# Patient Record
Sex: Female | Born: 1985 | Hispanic: Yes | Marital: Single | Smoking: Never smoker
Health system: Southern US, Community
[De-identification: ages and names within clinical notes are randomized; demographics above are authoritative.]

## PROBLEM LIST (undated history)

## (undated) DIAGNOSIS — Z789 Other specified health status: Secondary | ICD-10-CM

## (undated) DIAGNOSIS — N200 Calculus of kidney: Secondary | ICD-10-CM

---

## 2014-06-04 HISTORY — PX: CERVICAL BIOPSY  W/ LOOP ELECTRODE EXCISION: SUR135

## 2014-07-01 ENCOUNTER — Ambulatory Visit: Payer: Self-pay | Admitting: Family Medicine

## 2016-05-17 ENCOUNTER — Other Ambulatory Visit (HOSPITAL_COMMUNITY): Payer: Self-pay | Admitting: *Deleted

## 2016-05-17 ENCOUNTER — Other Ambulatory Visit: Payer: Self-pay | Admitting: *Deleted

## 2016-05-17 DIAGNOSIS — D492 Neoplasm of unspecified behavior of bone, soft tissue, and skin: Secondary | ICD-10-CM

## 2016-05-24 ENCOUNTER — Ambulatory Visit (HOSPITAL_COMMUNITY)
Admission: RE | Admit: 2016-05-24 | Discharge: 2016-05-24 | Disposition: A | Discharge status: Home / Self Care | Payer: Worker's Compensation | Source: Ambulatory Visit | Attending: *Deleted | Admitting: *Deleted

## 2016-05-24 DIAGNOSIS — D492 Neoplasm of unspecified behavior of bone, soft tissue, and skin: Secondary | ICD-10-CM

## 2016-05-31 ENCOUNTER — Other Ambulatory Visit (HOSPITAL_COMMUNITY): Payer: Self-pay | Admitting: *Deleted

## 2016-05-31 DIAGNOSIS — E041 Nontoxic single thyroid nodule: Secondary | ICD-10-CM

## 2016-06-06 ENCOUNTER — Encounter (HOSPITAL_COMMUNITY): Payer: Self-pay

## 2016-06-06 ENCOUNTER — Encounter (HOSPITAL_COMMUNITY)
Admission: RE | Admit: 2016-06-06 | Discharge: 2016-06-06 | Disposition: A | Discharge status: Home / Self Care | Payer: Self-pay | Source: Ambulatory Visit | Attending: *Deleted | Admitting: *Deleted

## 2016-06-06 DIAGNOSIS — E041 Nontoxic single thyroid nodule: Secondary | ICD-10-CM | POA: Insufficient documentation

## 2016-06-06 MED ORDER — SODIUM IODIDE I 131 CAPSULE
13.0000 | Freq: Once | INTRAVENOUS | Status: AC | PRN
  Administered 2016-06-06: 13 via ORAL

## 2016-06-07 ENCOUNTER — Encounter (HOSPITAL_COMMUNITY)
Admission: RE | Admit: 2016-06-07 | Discharge: 2016-06-07 | Disposition: A | Discharge status: Home / Self Care | Payer: Self-pay | Source: Ambulatory Visit | Attending: *Deleted | Admitting: *Deleted

## 2016-06-07 MED ORDER — SODIUM PERTECHNETATE TC 99M INJECTION
10.0000 | Freq: Once | INTRAVENOUS | Status: AC | PRN
  Administered 2016-06-07: 9.5 via INTRAVENOUS

## 2016-07-06 ENCOUNTER — Other Ambulatory Visit: Payer: Self-pay | Admitting: Otolaryngology

## 2016-07-06 DIAGNOSIS — E041 Nontoxic single thyroid nodule: Secondary | ICD-10-CM

## 2016-09-11 ENCOUNTER — Other Ambulatory Visit (HOSPITAL_COMMUNITY)
Admission: RE | Admit: 2016-09-11 | Discharge: 2016-09-11 | Disposition: A | Discharge status: Home / Self Care | Payer: BLUE CROSS/BLUE SHIELD | Source: Ambulatory Visit | Attending: Unknown Physician Specialty | Admitting: Unknown Physician Specialty

## 2016-09-11 DIAGNOSIS — R87610 Atypical squamous cells of undetermined significance on cytologic smear of cervix (ASC-US): Secondary | ICD-10-CM | POA: Insufficient documentation

## 2017-04-02 IMAGING — US US SOFT TISSUE HEAD/NECK
1 series · 13 of 25 positions shown · non-contrast
Comparison: None.

CLINICAL DATA: Palpable   neoplasm x2 months, minimal tenderness

EXAM:
THYROID ULTRASOUND
TECHNIQUE: Ultrasound examination of the thyroid gland and adjacent soft
tissues was performed.

[Series 1: us soft tissue head/neck · 0.07mm/px · 13 of 56 slices shown]
[im 1/56]
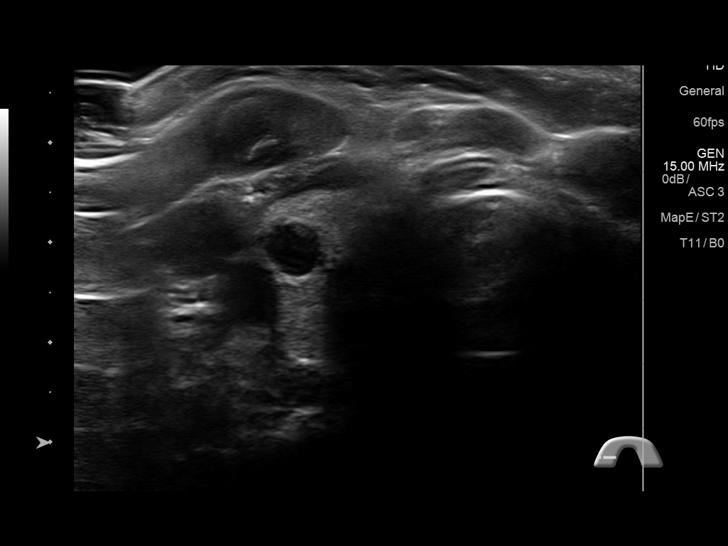
[im 5/56]
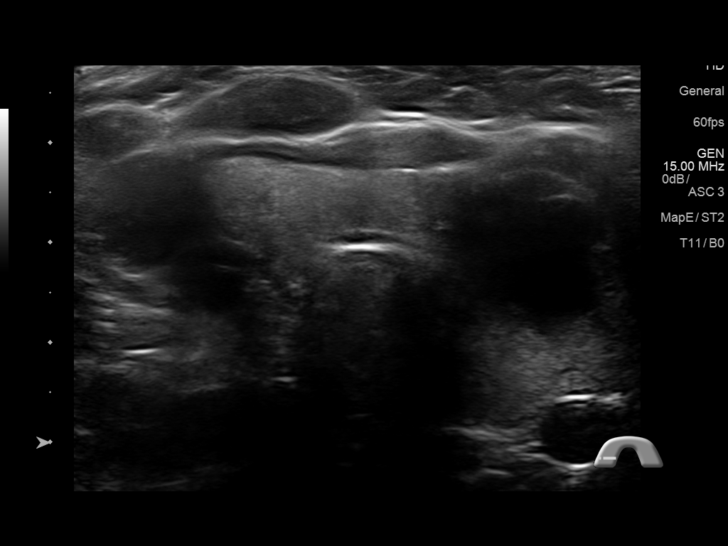
[im 10/56]
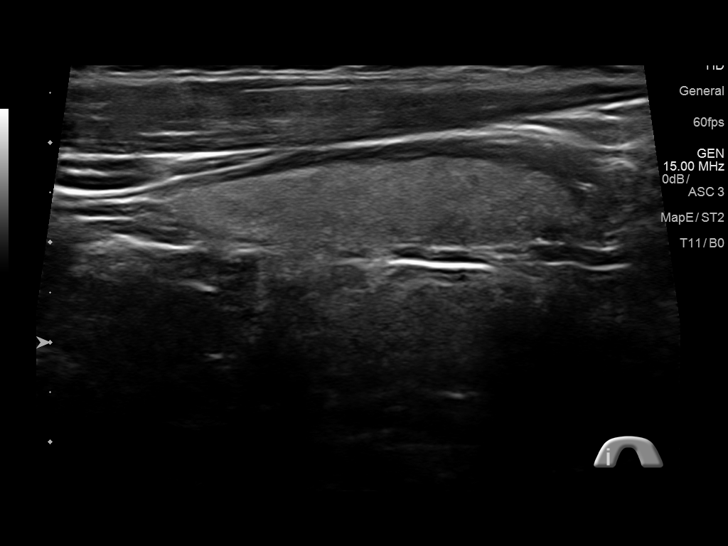
[im 14/56]
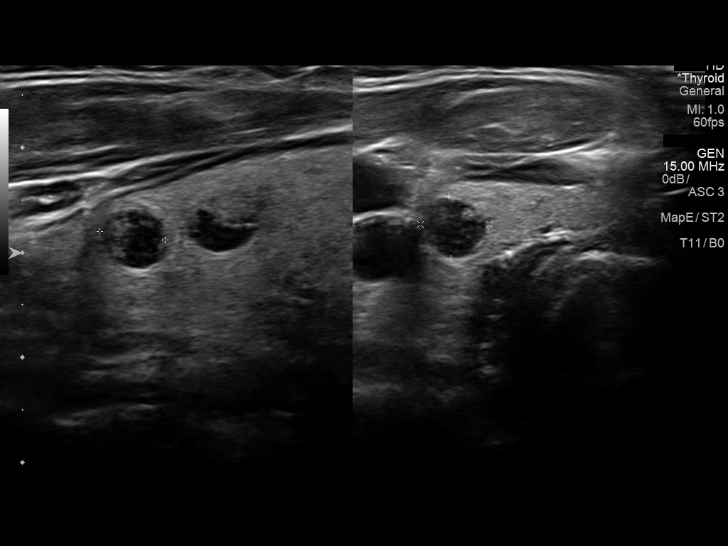
[im 19/56]
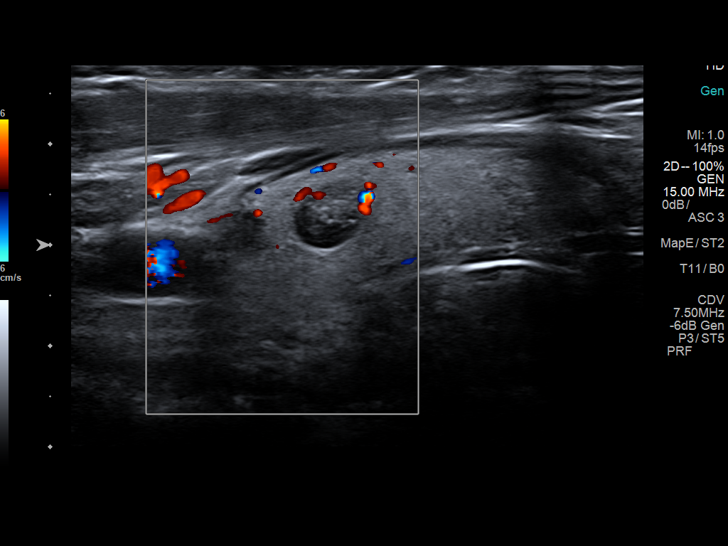
[im 23/56]
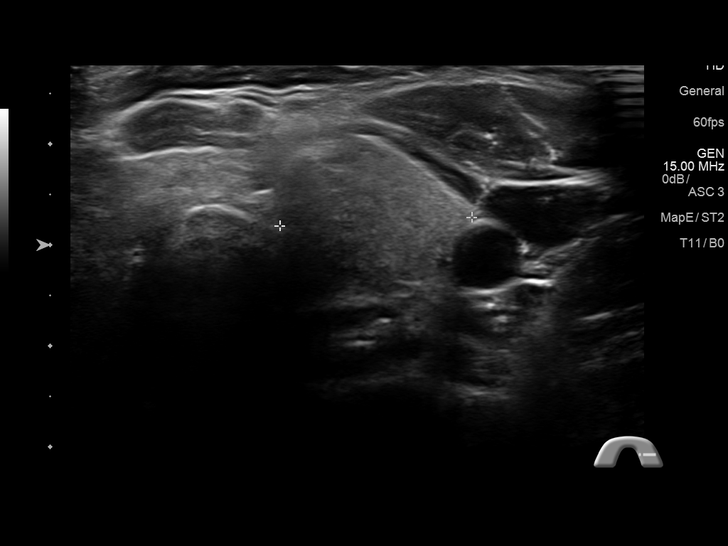
[im 28/56]
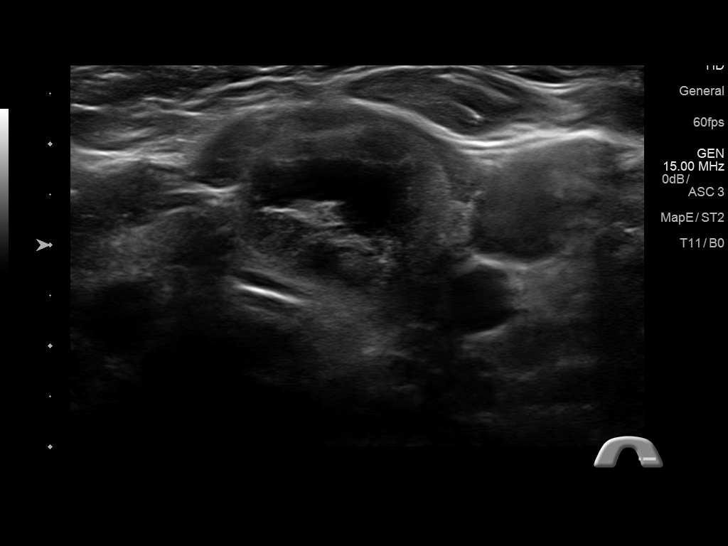
[im 33/56]
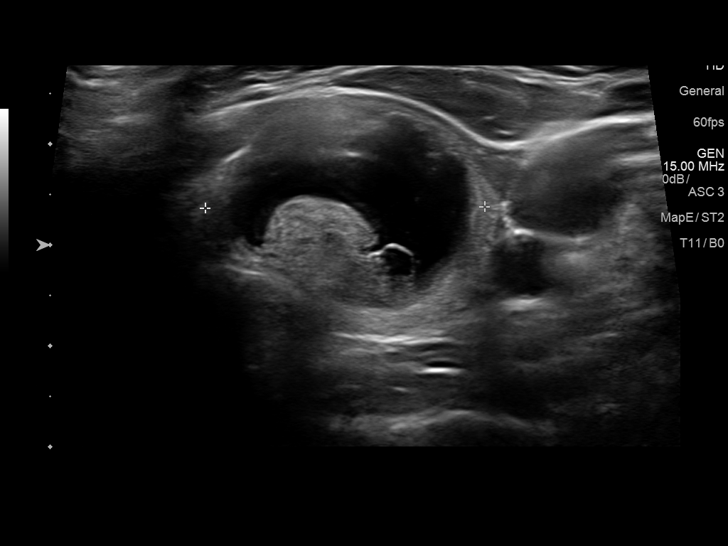
[im 37/56]
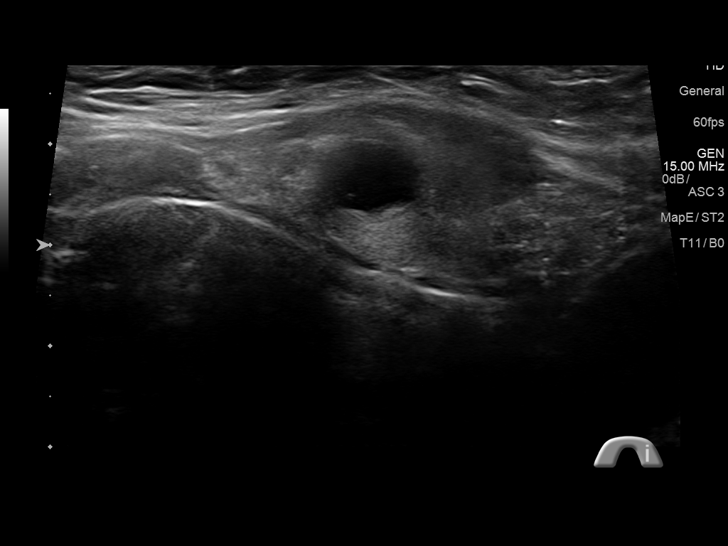
[im 42/56]
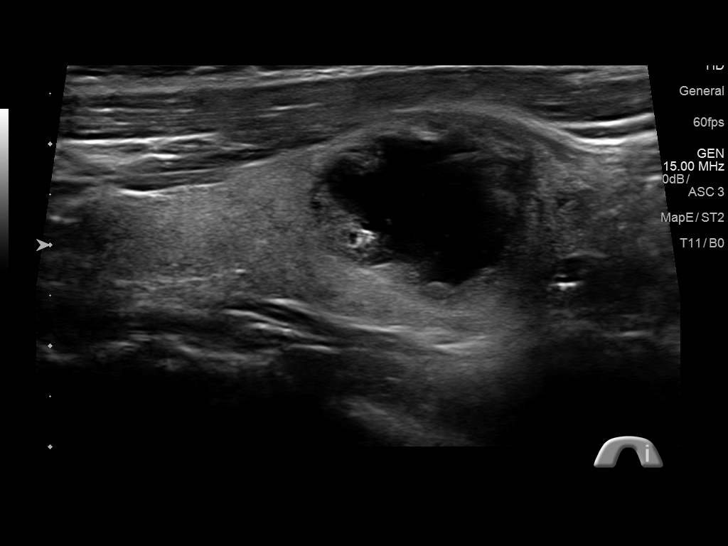
[im 46/56]
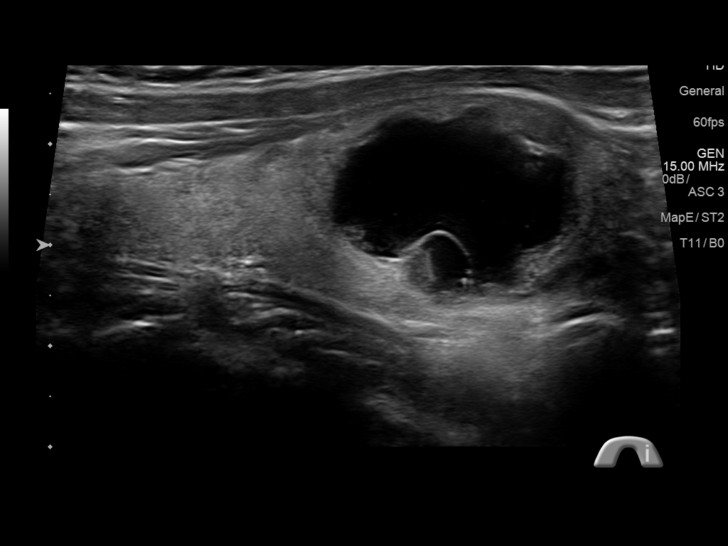
[im 51/56]
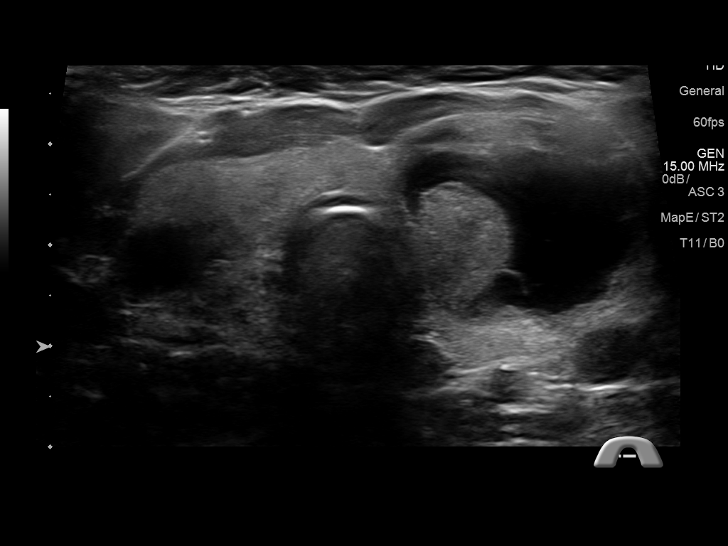
[im 56/56]
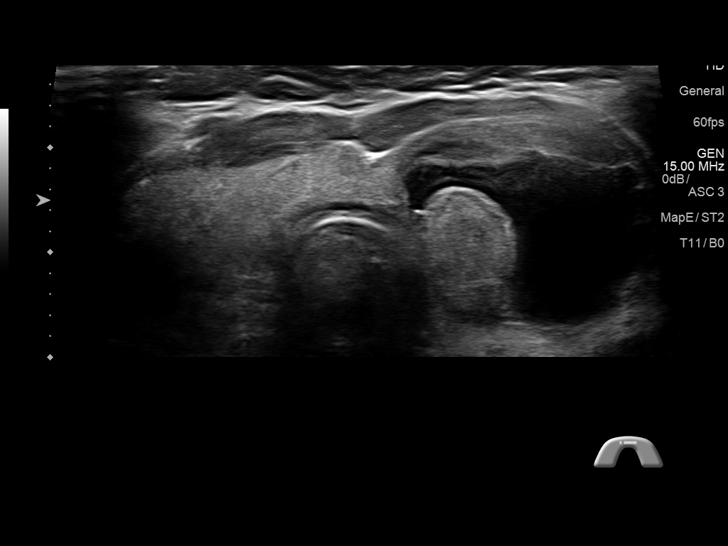

[13 of 25 positions shown; findings below may reference images not displayed]

FINDINGS: Parenchymal Echotexture: Mildly heterogenous

Estimated total number of nodules >/= 1 cm: 1

Number of spongiform nodules >/=  2 cm not described below (TR1): 0

Number of mixed cystic and solid nodules >/= 1.5 cm not described
below (TR2): 0

_________________________________________________________

Isthmus: 0.6 cm thickness

No discrete nodules are identified within the thyroid isthmus.

_________________________________________________________

Right lobe: 5.2 x 1.3 x 1.4 cm

Two subcentimeter complex cysts in the upper pole. Otherwise
negative.

_________________________________________________________

Left lobe: 5.8 x 2.3 x 1.9 cm

Nodule # 1:

Location: Left; Inferior

Size:  3.3 x 2.1 x 2.8 cm

Composition: mixed cystic and solid (1)

Echogenicity: isoechoic (1)

Shape: not taller-than-wide (0)

Margins: smooth (0)

Echogenic foci: none (0)

ACR TI-RADS total points: 2.

ACR TI-RADS risk category: TR2 (2 points).

ACR TI-RADS recommendations:

This nodule does NOT meet TI-RADS criteria for biopsy or dedicated
follow-up.
IMPRESSION: 1. Mild thyromegaly with bilateral complex cystic lesions.
None meets imaging criteria for biopsy or dedicated imaging
follow-up.
The larger 3.3 cm left inferior lesion accounts for the palpable
region.

The above is in keeping with the ACR TI-RADS recommendations - [HOSPITAL] 9028;[DATE].

## 2017-08-31 LAB — POCT GLYCOSYLATED HEMOGLOBIN (HGB A1C): Hemoglobin A1C: 5.1

## 2017-08-31 LAB — GLUCOSE, POCT (MANUAL RESULT ENTRY): POC Glucose: 110 mg/dl — AB (ref 70–99)

## 2017-09-04 ENCOUNTER — Ambulatory Visit: Payer: Self-pay | Admitting: Physician Assistant

## 2018-05-06 IMAGING — NM NM THYROID IMAGING W/ UPTAKE SINGLE (24 HR)
4 series · 4 of 4 positions shown · non-contrast
Comparison: Thyroid ultrasound 05/24/2016

CLINICAL DATA: Lump in neck, abnormal thyroid ultrasound

EXAM:
THYROID SCAN AND UPTAKE - 24 HOURS
TECHNIQUE: Following the per oral administration of A-YHY sodium iodide, the
patient returned at 24 hours and uptake measurements were acquired
with the uptake probe centered on the neck. Thyroid imaging was
performed following the intravenous administration of the Vc-XXm
Pertechnetate. 4-8HL unavailable for exam.
RADIOPHARMACEUTICALS:  13 MicroCuries A-YHY sodium iodide orally and
9.5 mCi Hechnetium-00m pertechnetate IV

[Series 1: ant w marker · 3.25mm/px · 1 of 1 slices shown]
[im 1/1]
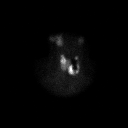

[Series 2: anterior · 3.25mm/px · 1 of 1 slices shown]
[im 1/1]
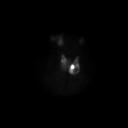

[Series 3: lao · 3.25mm/px · 1 of 1 slices shown]
[im 1/1]
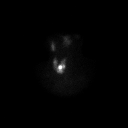

[Series 4: rao · 3.25mm/px · 1 of 1 slices shown]
[im 1/1]
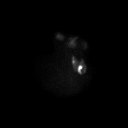

[4 of 4 positions shown; findings below may reference images not displayed]

FINDINGS: 24 hour radio iodine uptake is calculated at 21%, within the normal
range.

Imaging of the thyroid gland in 3 projections demonstrates a mixed
hot and cold nodule at the inferior pole the LEFT thyroid lobe,
corresponding to the large cystic lesion with a papillary
excrescence seen on ultrasound.

The focus of increased trace localization at the medial aspect of
the nodule corresponds to the papillary excrescence on ultrasound.

Findings are compatible with a degenerated thyroid adenoma.

No additional areas of increased or decreased tracer localization
seen.
IMPRESSION: Mixed hot and cold nodule at the inferior pole LEFT thyroid lobe
corresponding to the large cystic mass with a papillary excrescence
identified on prior sonography.

Focus of increased tracer accumulation corresponds to the papillary
excrescence.

Findings are consistent with a thyroid adenoma which has undergone
partial cystic degeneration. New line normal 24 hour radio iodine
uptake of 21%.

## 2018-06-04 NOTE — L&D Delivery Note (Addendum)
Delivery Note  First Stage: Labor onset: 8/19 at 2100 Augmentation: Pitocin/AROM Analgesia /Anesthesia intrapartum: epidural AROM 8/20 at 1739  Second Stage: Complete dilation 8/20 at  2116 Onset of pushing at 8/20 at 2123 FHR second stage Cat I, 135bpm  Delivery of a viable female on 01/22/19 at 2129 by R Chastity Noland CNM delivery of fetal head in LOA position with restitution to LOT. No nuchal cord;  Anterior then posterior shoulders delivered easily with gentle downward traction. Baby placed on mom's chest, and attended to by peds.  Cord double clamped after cessation of pulsation, cut by FOB Cord blood sample collected   Third Stage: Placenta delivered spontaneous intact with [facility] @ 2134 Placenta disposition: routine disposal Uterine tone Firm / bleeding small  1st deg perineal laceration identified  Anesthesia for repair: epidural Repair 3-0 Vicryl SH x 1, single interrupted stitch to achieve hemostasis and cosmesis.  Est. Blood Loss (mL): [id number]  Complications: none  Mom to postpartum.  Baby to Couplet care / Skin to Skin.  Newborn: Birth Weight: 7#9  Apgar Scores: 9/9 Feeding planned: breast and formula

## 2018-07-23 LAB — OB RESULTS CONSOLE VARICELLA ZOSTER ANTIBODY, IGG: Varicella: IMMUNE

## 2018-07-23 LAB — OB RESULTS CONSOLE RUBELLA ANTIBODY, IGM: Rubella: IMMUNE

## 2018-07-23 LAB — OB RESULTS CONSOLE HEPATITIS B SURFACE ANTIGEN: Hepatitis B Surface Ag: NEGATIVE

## 2018-08-06 LAB — OB RESULTS CONSOLE GC/CHLAMYDIA
Chlamydia: NEGATIVE
Gonorrhea: NEGATIVE

## 2018-08-15 ENCOUNTER — Other Ambulatory Visit (HOSPITAL_COMMUNITY): Payer: Self-pay | Admitting: Family Medicine

## 2018-08-15 ENCOUNTER — Other Ambulatory Visit: Payer: Self-pay | Admitting: Family Medicine

## 2018-10-22 LAB — OB RESULTS CONSOLE HIV ANTIBODY (ROUTINE TESTING): HIV: NONREACTIVE

## 2018-10-22 LAB — OB RESULTS CONSOLE RPR: RPR: NONREACTIVE

## 2019-01-22 ENCOUNTER — Inpatient Hospital Stay: Payer: Medicaid Other | Admitting: Anesthesiology

## 2019-01-22 ENCOUNTER — Inpatient Hospital Stay
Admission: EM | Admit: 2019-01-22 | Discharge: 2019-01-24 | DRG: 807 | Disposition: A | Discharge status: Home / Self Care | Payer: Medicaid Other | Attending: Obstetrics and Gynecology | Admitting: Obstetrics and Gynecology

## 2019-01-22 ENCOUNTER — Other Ambulatory Visit: Payer: Self-pay

## 2019-01-22 DIAGNOSIS — Z20828 Contact with and (suspected) exposure to other viral communicable diseases: Secondary | ICD-10-CM | POA: Diagnosis present

## 2019-01-22 DIAGNOSIS — Z3A40 40 weeks gestation of pregnancy: Secondary | ICD-10-CM

## 2019-01-22 DIAGNOSIS — O26893 Other specified pregnancy related conditions, third trimester: Secondary | ICD-10-CM | POA: Diagnosis present

## 2019-01-22 DIAGNOSIS — Z3490 Encounter for supervision of normal pregnancy, unspecified, unspecified trimester: Secondary | ICD-10-CM

## 2019-01-22 HISTORY — DX: Other specified health status: Z78.9

## 2019-01-22 HISTORY — DX: Calculus of kidney: N20.0

## 2019-01-22 LAB — CBC
HCT: 40.9 % (ref 36.0–46.0)
Hemoglobin: 14.2 g/dL (ref 12.0–15.0)
MCH: 30.1 pg (ref 26.0–34.0)
MCHC: 34.7 g/dL (ref 30.0–36.0)
MCV: 86.7 fL (ref 80.0–100.0)
Platelets: 188 10*3/uL (ref 150–400)
RBC: 4.72 MIL/uL (ref 3.87–5.11)
RDW: 13.2 % (ref 11.5–15.5)
WBC: 7.9 10*3/uL (ref 4.0–10.5)
nRBC: 0 % (ref 0.0–0.2)

## 2019-01-22 LAB — TYPE AND SCREEN
ABO/RH(D): O POS
Antibody Screen: NEGATIVE

## 2019-01-22 LAB — SARS CORONAVIRUS 2 BY RT PCR (HOSPITAL ORDER, PERFORMED IN ~~LOC~~ HOSPITAL LAB): SARS Coronavirus 2: NEGATIVE

## 2019-01-22 MED ORDER — LACTATED RINGERS IV SOLN
500.0000 mL | INTRAVENOUS | Status: DC | PRN
Start: 2019-01-22 — End: 2019-01-22
  Administered 2019-01-22: 250 mL via INTRAVENOUS

## 2019-01-22 MED ORDER — MISOPROSTOL 200 MCG PO TABS
ORAL_TABLET | ORAL | Status: AC
  Filled 2019-01-22: qty 4

## 2019-01-22 MED ORDER — OXYTOCIN 10 UNIT/ML IJ SOLN
INTRAMUSCULAR | Status: AC
  Filled 2019-01-22: qty 2

## 2019-01-22 MED ORDER — SOD CITRATE-CITRIC ACID 500-334 MG/5ML PO SOLN: 30.0000 mL | ORAL | Status: DC | PRN

## 2019-01-22 MED ORDER — FENTANYL CITRATE (PF) 100 MCG/2ML IJ SOLN: 50.0000 ug | INTRAMUSCULAR | Status: DC | PRN

## 2019-01-22 MED ORDER — LIDOCAINE HCL (PF) 1 % IJ SOLN
INTRAMUSCULAR | Status: DC | PRN
  Administered 2019-01-22: 3 mL via INTRADERMAL

## 2019-01-22 MED ORDER — PHENYLEPHRINE 40 MCG/ML (10ML) SYRINGE FOR IV PUSH (FOR BLOOD PRESSURE SUPPORT): 80.0000 ug | PREFILLED_SYRINGE | INTRAVENOUS | Status: DC | PRN

## 2019-01-22 MED ORDER — FENTANYL 2.5 MCG/ML W/ROPIVACAINE 0.15% IN NS 100 ML EPIDURAL (ARMC)
12.0000 mL/h | EPIDURAL | Status: DC
  Administered 2019-01-22: 12 mL/h via EPIDURAL

## 2019-01-22 MED ORDER — TERBUTALINE SULFATE 1 MG/ML IJ SOLN: 0.2500 mg | Freq: Once | INTRAMUSCULAR | Status: DC | PRN

## 2019-01-22 MED ORDER — LACTATED RINGERS IV SOLN
INTRAVENOUS | Status: DC
  Administered 2019-01-22 (×2): via INTRAVENOUS

## 2019-01-22 MED ORDER — FENTANYL 2.5 MCG/ML W/ROPIVACAINE 0.15% IN NS 100 ML EPIDURAL (ARMC)
EPIDURAL | Status: AC
  Filled 2019-01-22: qty 100

## 2019-01-22 MED ORDER — OXYTOCIN 40 UNITS IN NORMAL SALINE INFUSION - SIMPLE MED: 2.5000 [IU]/h | INTRAVENOUS | Status: DC

## 2019-01-22 MED ORDER — LACTATED RINGERS IV SOLN
500.0000 mL | Freq: Once | INTRAVENOUS | Status: AC
  Administered 2019-01-22: 500 mL via INTRAVENOUS

## 2019-01-22 MED ORDER — DIPHENHYDRAMINE HCL 50 MG/ML IJ SOLN: 12.5000 mg | INTRAMUSCULAR | Status: DC | PRN

## 2019-01-22 MED ORDER — ONDANSETRON HCL 4 MG/2ML IJ SOLN: 4.0000 mg | Freq: Four times a day (QID) | INTRAMUSCULAR | Status: DC | PRN

## 2019-01-22 MED ORDER — OXYTOCIN BOLUS FROM INFUSION
500.0000 mL | Freq: Once | INTRAVENOUS | Status: AC
  Administered 2019-01-22: 500 mL via INTRAVENOUS

## 2019-01-22 MED ORDER — EPHEDRINE 5 MG/ML INJ: 10.0000 mg | INTRAVENOUS | Status: DC | PRN

## 2019-01-22 MED ORDER — LIDOCAINE HCL (PF) 1 % IJ SOLN
INTRAMUSCULAR | Status: AC
  Filled 2019-01-22: qty 30

## 2019-01-22 MED ORDER — OXYTOCIN 40 UNITS IN NORMAL SALINE INFUSION - SIMPLE MED
1.0000 m[IU]/min | INTRAVENOUS | Status: DC
  Administered 2019-01-22: 2 m[IU]/min via INTRAVENOUS
  Filled 2019-01-22: qty 1000

## 2019-01-22 MED ORDER — ACETAMINOPHEN 325 MG PO TABS
ORAL_TABLET | ORAL | Status: AC
  Filled 2019-01-22: qty 1

## 2019-01-22 MED ORDER — LIDOCAINE-EPINEPHRINE (PF) 1.5 %-1:200000 IJ SOLN
INTRAMUSCULAR | Status: DC | PRN
  Administered 2019-01-22: 3 mL via EPIDURAL

## 2019-01-22 MED ORDER — LIDOCAINE HCL (PF) 1 % IJ SOLN: 30.0000 mL | INTRAMUSCULAR | Status: DC | PRN

## 2019-01-22 MED ORDER — BUPIVACAINE HCL (PF) 0.25 % IJ SOLN
INTRAMUSCULAR | Status: DC | PRN
  Administered 2019-01-22 (×2): 4 mL via EPIDURAL

## 2019-01-22 MED ORDER — ACETAMINOPHEN 325 MG PO TABS
650.0000 mg | ORAL_TABLET | ORAL | Status: DC | PRN
  Administered 2019-01-22: 650 mg via ORAL
  Filled 2019-01-22: qty 2

## 2019-01-22 MED ORDER — AMMONIA AROMATIC IN INHA
RESPIRATORY_TRACT | Status: AC
  Filled 2019-01-22: qty 10

## 2019-01-22 NOTE — Progress Notes (Signed)
Spanish interpreter Gerald Stabs [phone] used for triage

## 2019-01-22 NOTE — Anesthesia Procedure Notes (Signed)
Epidural Patient location during procedure: OB  Staffing Anesthesiologist: Piscitello, Precious Haws, MD Resident/CRNA: Rolla Plate, CRNA Performed: resident/CRNA   Preanesthetic Checklist Completed: patient identified, site marked, surgical consent, pre-op evaluation, timeout performed, IV checked, risks and benefits discussed and monitors and equipment checked  Epidural Patient position: sitting Prep: ChloraPrep and site prepped and draped Patient monitoring: heart rate, continuous pulse ox and blood pressure Approach: midline Location: L4-L5 Injection technique: LOR saline  Needle:  Needle type: Tuohy  Needle gauge: 17 G Needle length: 9 cm and 9 Needle insertion depth: 6 cm Catheter type: closed end flexible Catheter size: 19 Gauge Catheter at skin depth: 11 cm Test dose: negative and 1.5% lidocaine with Epi 1:200 K  Assessment Events: blood not aspirated, injection not painful, no injection resistance, negative IV test and no paresthesia  Additional Notes   Patient tolerated the insertion well without complications.Reason for block:procedure for pain

## 2019-01-22 NOTE — OB Triage Note (Signed)
Pt arrival to triage with c/o contractions every 6 min, starting around 9pm last evening.  Pt denies vaginal bleeding and LOF.  States positive fetal movement.  EFM and toco applied and assessing.

## 2019-01-22 NOTE — Progress Notes (Signed)
Labor Progress Note  Wanda Bass is a 33 y.o. G3P2002 at [redacted]w[redacted]d by LMP admitted for augmentation of labor at term, hx LEEP and Cat II tracing intermittently.   Subjective: feeling a little more pain than previously.   Objective: BP 112/73 (BP Location: Right Arm)   Pulse 77   Temp 98.2 F (36.8 C) (Oral)   Resp 16   Ht 5' 1.02" (1.55 m)   Wt 76.7 kg   LMP 04/17/2018 (Exact Date)   SpO2 96%   BMI 31.91 kg/m  Notable VS details: reviewed  Fetal Assessment: FHT:  FHR: 135 bpm, variability: moderate,  accelerations:  Present,  decelerations:  Present intermittent variable decels.  Category/reactivity:  Category II UC:   regular, every 2-5 minutes, Pitocin at 81mu/min SVE:   No change, remains tight 1cm/100%/-1 anterior, BBOW noted with UC during exam.  Membrane status:  intact    Labs: Lab Results  Component Value Date   WBC 7.9 01/22/2019   HGB 14.2 01/22/2019   HCT 40.9 01/22/2019   MCV 86.7 01/22/2019   PLT 188 01/22/2019   COVID negative  Assessment / Plan: G3P2 at 40+0wks, latent phase labor  Labor: contracting well on Pitocin, plan pain mgmt with epidural, then will AROM.  Preeclampsia:  no e/o Pre-e Fetal Wellbeing:  Category II intermittently with occasional variable decels.  Pain Control:  Epidural I/D:  n/a Anticipated MOD:  NSVD  Murray Hodgkins Hyman Crossan, CNM 01/22/2019, 1:50 PM

## 2019-01-22 NOTE — Anesthesia Preprocedure Evaluation (Signed)
Anesthesia Evaluation  Patient identified by MRN, date of birth, ID band Patient awake    Reviewed: Allergy & Precautions, H&P , NPO status , Patient's Chart, lab work & pertinent test results  Airway Mallampati: III  TM Distance: >3 FB Neck ROM: full    Dental  (+) Dental Advidsory Given, Poor Dentition   Pulmonary neg pulmonary ROS,           Cardiovascular negative cardio ROS       Neuro/Psych negative neurological ROS  negative psych ROS   GI/Hepatic Neg liver ROS, GERD  ,  Endo/Other    Renal/GU Renal disease  negative genitourinary   Musculoskeletal   Abdominal   Peds  Hematology negative hematology ROS (+)   Anesthesia Other Findings   Reproductive/Obstetrics (+) Pregnancy                             Anesthesia Physical Anesthesia Plan  ASA: II  Anesthesia Plan: Epidural   Post-op Pain Management:    Induction:   PONV Risk Score and Plan:   Airway Management Planned:   Additional Equipment:   Intra-op Plan:   Post-operative Plan:   Informed Consent: I have reviewed the patients History and Physical, chart, labs and discussed the procedure including the risks, benefits and alternatives for the proposed anesthesia with the patient or authorized representative who has indicated his/her understanding and acceptance.     Dental Advisory Given  Plan Discussed with: Anesthesiologist and CRNA  Anesthesia Plan Comments:         Anesthesia Quick Evaluation

## 2019-01-22 NOTE — Progress Notes (Signed)
Labor Progress Note  Wanda Bass is a 33 y.o. G3P2002 at [redacted]w[redacted]d by LMP admitted for augmentation of labor at term, hx LEEP and Cat II tracing intermittently.   Subjective: comfortable after epidural.   Objective: BP (!) 102/59 (BP Location: Right Arm)   Pulse 75   Temp 98.3 F (36.8 C) (Oral)   Resp 17   Ht 5' 1.02" (1.55 m)   Wt 76.7 kg   LMP 04/17/2018 (Exact Date)   SpO2 97%   BMI 31.91 kg/m  Notable VS details: reviewed  Fetal Assessment: FHT:  FHR: 130 bpm, variability: moderate,  accelerations:  Present,  decelerations:  Present intermittent variable and late decels.  Category/reactivity:  Category II UC:   regular, every 2-5 minutes, Pitocin at 33mu/min SVE:   4/C/-1, BBOW, head well applied, AROM performed, small amt clear fluid.   Membrane status: AROM at 1739    Labs: Lab Results  Component Value Date   WBC 7.9 01/22/2019   HGB 14.2 01/22/2019   HCT 40.9 01/22/2019   MCV 86.7 01/22/2019   PLT 188 01/22/2019   COVID negative  Assessment / Plan: G3P2 at 40+0wks, latent phase labor  Labor: making cervical change now, still has tight band of scar tissue, but now 4cm.  Preeclampsia:  no e/o Pre-e Fetal Wellbeing:  Category II intermittently with occasional variable and late decels. None recurrent, will monitor closely.   Pain Control:  Epidural I/D:  n/a Anticipated MOD:  NSVD  Murray Hodgkins McVey, CNM 01/22/2019, 6:13 PM

## 2019-01-22 NOTE — H&P (Signed)
OB History & Physical   History of Present Illness:  Chief Complaint: contractions  HPI:  Wanda Bass is a 33 y.o. G20P2002 female at [redacted]w[redacted]d dated by LMP, c/w US done at [redacted]w[redacted]d.  She presents to L&D for painful contractions in lower abdomen since early this morning, about q63min. Reports active FM, denies LOF or VB.   Fetal monitoring reviewed since arrival around 0600, noted occasional early and late decels with spontaneous resolution. FHR baseline from 120-140s. D/w Dr Leafy Ro and decision to admit and augment labor based on intermittent cat II tracing.    Pregnancy Issues: 1. Hx LEEP/HSIL pap 01/2015    Maternal Medical History:   Past Medical History:  Diagnosis Date  . Kidney stones   . Medical history non-contributory     Past Surgical History:  Procedure Laterality Date  . CERVICAL BIOPSY  W/ LOOP ELECTRODE EXCISION  2016    No Known Allergies  Prior to Admission medications   Medication Sig Start Date End Date Taking? Authorizing Provider  Prenatal Vit-Fe Fumarate-FA (PRENATAL MULTIVITAMIN) TABS tablet Take 1 tablet by mouth daily at 12 noon.   Yes [provider]     [facility] site: [facility]   Social History: She  reports that she has never smoked. She has never used smokeless tobacco. She reports that she does not drink alcohol or use drugs.  Family History: no family hx Gyn cancers  Review of Systems: A full review of systems was performed and negative except as noted in the HPI.     Physical Exam:  Vital Signs: BP 114/70 (BP Location: Left Arm)   Pulse 74   Temp 98.1 F (36.7 C) (Oral)   Resp 16   Ht 5' 1.02" (1.55 m)   Wt 76.7 kg   BMI 31.91 kg/m  General: no acute distress.  HEENT: normocephalic, atraumatic Heart: regular rate & rhythm.  No murmurs/rubs/gallops Lungs: clear to auscultation bilaterally, normal respiratory effort Abdomen: soft, gravid, non-tender;  EFW: 7lbs Pelvic:   External: Normal external female  genitalia  Cervix: 1/100/-1, soft/anterior   Extremities: non-tender, symmetric, No edema bilaterally.  DTRs: 2+  Neurologic: Alert & oriented x 3.    No results found for this or any previous visit (from the past 24 hour(s)).  Pertinent Results:  Prenatal Labs: Blood type/Rh  O Pos  Antibody screen neg  Rubella Immune  Varicella Immune  RPR NR  HBsAg Neg  HIV NR  GC neg  Chlamydia neg  Genetic screening negative  1 hour GTT  124  GBS  negative   FHT: 140bpm, moderate variability, + accels, no decels currently.  TOCO: q4-44min, palpate moderate, 0000000   Cephalic by leopolds and confirmed with Bedside US.   No results found.  Assessment:  Wanda Bass is a 33 y.o. G5P2002 female at [redacted]w[redacted]d with early labor and intermittent Cat II tracing.   Plan:  1. Admit to Labor & Delivery; consents reviewed and obtained - COVID swab pending. - d/w Dr Leafy Ro, will augment with pitocin, then examine and attempt to break scar tissue from LEEP.   2. Fetal Well being  - Fetal Tracing: Cat I tracing currently. Intermittent decels noted from 0600 until 0820 on review of tracing.  - Group B Streptococcus ppx indicated: negative - Presentation: cephalic confirmed by [place] and Leopolds   3. Routine OB: - Prenatal labs reviewed, as above - Rh O Pos - CBC, T&S, RPR on admit - Clear fluids, IVF  4.  Augmentation of Labor -  Contractions: external toco in place -  Pelvis proven to 8#10 -  Plan for induction with Pitocin, AROM -  Plan for continuous fetal monitoring  -  Maternal pain control as desired; requesting regional anesthesia - Anticipate vaginal delivery  5. Post Partum Planning: - Infant feeding: Both breast and bottle - Contraception: [place], CNM 01/22/19 9:50 AM

## 2019-01-22 NOTE — Discharge Summary (Signed)
Obstetrical Discharge Summary  Patient Name: Wanda Bass DOB: 19-May-1986 MRN: [id number]  Date of Admission: 01/22/2019 Date of Delivery: 01/22/19 Delivered by: Hassan Buckler CNM Date of Discharge: 01/24/2019  [facility]:  [facility]  [id number] last menstrual period was 04/17/2018 (exact date). EDC Estimated Date of Delivery: 01/22/19 Gestational Age at Delivery: [redacted]w[redacted]d   Antepartum complications:  1. Hx LEEP/HSIL pap 01/2015  Admitting Diagnosis:  Early labor Secondary Diagnosis: SVD, 1st deg lac  Patient Active Problem List   Diagnosis Date Noted  . Pregnancy 01/22/2019  . Labor and delivery, indication for care 01/22/2019    Augmentation: AROM and Pitocin Complications: None Intrapartum complications/course: Labor augmented with pitocin and AROM, progressed from 1cm to 4cm at AROM, then to C/C/+1 and delivered with 2 UCs pushing.  Date of Delivery: 01/22/19 Delivered By: Hassan Buckler CNM Delivery Type: spontaneous vaginal delivery Anesthesia: epidural Placenta: spontaneous Laceration: 1st deg perineal  Episiotomy: none Newborn Data: Live born female  Birth Weight: 3430g 7lb 9oz APGAR: 90, 9  Newborn Delivery   Birth date/time: 01/22/2019 21:29:00 Delivery type: Vaginal, Spontaneous       Postpartum Procedures:  none  Post partum course:  Patient had an uncomplicated postpartum course.  By time of discharge on PPD#2, her pain was controlled on oral pain medications; she had appropriate lochia and was ambulating, voiding without difficulty and tolerating regular diet.  She was deemed stable for discharge to home.    Discharge Physical Exam:  BP 105/70 (BP Location: Left Arm)   Pulse 74   Temp 97.8 F (36.6 C) (Oral)   Resp 18   Ht 5' 1.02" (1.55 m)   Wt 76.7 kg   LMP 04/17/2018 (Exact Date)   SpO2 99%   Breastfeeding Unknown   BMI 31.91 kg/m   General: NAD CV: RRR Pulm: CTABL, nl effort ABD: s/nd/nt, fundus firm and below the  umbilicus Lochia: moderate Fundus: Firm  DVT Evaluation: LE non-ttp, no evidence of DVT on exam.  Hemoglobin  Date Value Ref Range Status  01/23/2019 13.5 12.0 - 15.0 g/dL Final   HCT  Date Value Ref Range Status  01/23/2019 38.9 36.0 - 46.0 % Final     Disposition: stable, discharge to home. Baby Feeding: breastmilk  Baby Disposition: home with mom  Rh Immune globulin given: n/a Rubella vaccine given: immune Varicella vaccine given: immune  Contraception: Nexplanon  Prenatal Labs:  Blood type/Rh  O Pos  Antibody screen neg  Rubella Immune  Varicella Immune  RPR NR  HBsAg Neg  HIV NR  GC neg  Chlamydia neg  Genetic screening negative  1 hour GTT  124  GBS  negative    Plan:  Wanda Bass was discharged to home in good condition. Follow-up appointment with delivering provider in 6 weeks.  Discharge Medications: Allergies as of 01/24/2019   No Known Allergies     Medication List    TAKE these medications   acetaminophen 325 MG tablet Commonly known as: Tylenol Take 2 tablets (650 mg total) by mouth every 4 (four) hours as needed for mild pain or moderate pain.   ibuprofen 600 MG tablet Commonly known as: ADVIL Take 1 tablet (600 mg total) by mouth every 6 (six) hours as needed for mild pain, moderate pain or cramping.   prenatal multivitamin Tabs tablet Take 1 tablet by mouth daily at 12 noon.       Follow-up Information    McVey, Murray Hodgkins, CNM Follow up in 6 week(s).  Specialty: Obstetrics and Gynecology Why: routine PP visit Contact information: [address] [place] [address] [phone]           Signed: Lisette Grinder, CNM 01/24/2019  8:17 AM

## 2019-01-23 LAB — CBC
HCT: 38.9 % (ref 36.0–46.0)
Hemoglobin: 13.5 g/dL (ref 12.0–15.0)
MCH: 30.5 pg (ref 26.0–34.0)
MCHC: 34.7 g/dL (ref 30.0–36.0)
MCV: 87.8 fL (ref 80.0–100.0)
Platelets: 171 10*3/uL (ref 150–400)
RBC: 4.43 MIL/uL (ref 3.87–5.11)
RDW: 13.2 % (ref 11.5–15.5)
WBC: 11.2 10*3/uL — ABNORMAL HIGH (ref 4.0–10.5)
nRBC: 0 % (ref 0.0–0.2)

## 2019-01-23 LAB — RPR: RPR Ser Ql: NONREACTIVE

## 2019-01-23 MED ORDER — ONDANSETRON HCL 4 MG PO TABS: 4.0000 mg | ORAL_TABLET | ORAL | Status: DC | PRN

## 2019-01-23 MED ORDER — IBUPROFEN 600 MG PO TABS
600.0000 mg | ORAL_TABLET | Freq: Four times a day (QID) | ORAL | Status: DC
  Administered 2019-01-23 – 2019-01-24 (×6): 600 mg via ORAL
  Filled 2019-01-23 (×4): qty 1

## 2019-01-23 MED ORDER — ZOLPIDEM TARTRATE 5 MG PO TABS: 5.0000 mg | ORAL_TABLET | Freq: Every evening | ORAL | Status: DC | PRN

## 2019-01-23 MED ORDER — ACETAMINOPHEN 325 MG PO TABS
650.0000 mg | ORAL_TABLET | ORAL | Status: DC | PRN
  Administered 2019-01-23 – 2019-01-24 (×5): 650 mg via ORAL
  Filled 2019-01-23 (×4): qty 2

## 2019-01-23 MED ORDER — ONDANSETRON HCL 4 MG/2ML IJ SOLN: 4.0000 mg | INTRAMUSCULAR | Status: DC | PRN

## 2019-01-23 MED ORDER — DIPHENHYDRAMINE HCL 25 MG PO CAPS: 25.0000 mg | ORAL_CAPSULE | Freq: Four times a day (QID) | ORAL | Status: DC | PRN

## 2019-01-23 MED ORDER — SIMETHICONE 80 MG PO CHEW: 80.0000 mg | CHEWABLE_TABLET | ORAL | Status: DC | PRN

## 2019-01-23 MED ORDER — WITCH HAZEL-GLYCERIN EX PADS: 1.0000 "application " | MEDICATED_PAD | CUTANEOUS | Status: DC | PRN

## 2019-01-23 MED ORDER — COCONUT OIL OIL
1.0000 "application " | TOPICAL_OIL | Status: DC | PRN
  Administered 2019-01-23: 1 via TOPICAL
  Filled 2019-01-23: qty 120

## 2019-01-23 MED ORDER — SENNOSIDES-DOCUSATE SODIUM 8.6-50 MG PO TABS
2.0000 | ORAL_TABLET | Freq: Every day | ORAL | Status: DC
  Administered 2019-01-23: 2 via ORAL
  Filled 2019-01-23: qty 2

## 2019-01-23 MED ORDER — BENZOCAINE-MENTHOL 20-0.5 % EX AERO
1.0000 "application " | INHALATION_SPRAY | CUTANEOUS | Status: DC | PRN
  Administered 2019-01-23: 1 via TOPICAL
  Filled 2019-01-23: qty 56

## 2019-01-23 MED ORDER — DIBUCAINE (PERIANAL) 1 % EX OINT: 1.0000 "application " | TOPICAL_OINTMENT | CUTANEOUS | Status: DC | PRN

## 2019-01-23 MED ORDER — PRENATAL MULTIVITAMIN CH
1.0000 | ORAL_TABLET | Freq: Every day | ORAL | Status: DC
  Administered 2019-01-23: 12:00:00 1 via ORAL
  Filled 2019-01-23: qty 1

## 2019-01-23 NOTE — Anesthesia Postprocedure Evaluation (Signed)
Anesthesia Post Note  Patient: [employer]  Procedure(s) Performed: AN AD [place]  Patient location during evaluation: Mother Baby Anesthesia Type: Epidural Level of consciousness: awake, awake and alert and oriented Pain management: pain level controlled Vital Signs Assessment: post-procedure vital signs reviewed and stable Respiratory status: spontaneous breathing, nonlabored ventilation and respiratory function stable Cardiovascular status: blood pressure returned to baseline Postop Assessment: no headache and no backache Anesthetic complications: no     Last Vitals:  Vitals:   01/23/19 0632 01/23/19 0739  BP: 114/67 93/60  Pulse: 65 63  Resp: 18 18  Temp: 37.1 C 36.6 C  SpO2: 100% 98%    Last Pain:  Vitals:   01/23/19 0739  TempSrc: Oral  PainSc:                  Johnna Acosta

## 2019-01-23 NOTE — Discharge Instructions (Signed)
Discharge Instructions:   Follow-up Appointment: Schedule a follow-up appointment ASAP in 6 weeks with [facility]!   If there are any new medications, they have been ordered and will be available for pickup at the listed pharmacy on your way home from the hospital.   Call office if you have any of the following: headache, visual changes, fever >101.0 F, chills, shortness of breath, breast concerns, excessive vaginal bleeding, incision drainage or problems, leg pain or redness, depression or any other concerns. If you have vaginal discharge with an odor, let your doctor know.   It is normal to bleed for up to 6 weeks. You should not soak through more than 1 pad in 1 hour. If you have a blood clot larger than your fist with continued bleeding, call your doctor.   Activity: Do not lift > 10 lbs for 6 weeks (do not lift anything heavier than your baby). No intercourse, tampons, swimming pools, hot tubs, baths (only showers) for 6 weeks.  No driving for 1-2 weeks. Continue prenatal vitamin, especially if breastfeeding. Increase calories and fluids (water) while breastfeeding.   Your milk will come in, in the next couple of days (right now it is colostrum). You may have a slight fever when your milk comes in, but it should go away on its own.  If it does not, and rises above 101 F please call the doctor. You will also feel achy and your breasts will be firm. They will also start to leak. If you are breastfeeding, continue as you have been and you can pump/express milk for comfort.   If you have too much milk, your breasts can become engorged, which could lead to mastitis. This is an infection of the milk ducts. It can be very painful and you will need to notify your doctor to obtain a prescription for antibiotics. You can also treat it with a shower or hot/cold compress.   For concerns about your baby, please call your pediatrician.  For breastfeeding concerns, the lactation consultant can be  reached at [phone].   Postpartum blues (feelings of happy one minute and sad another minute) are normal for the first few weeks but if it gets worse let your doctor know.   Congratulations! We enjoyed caring for you and your new bundle of joy!

## 2019-01-23 NOTE — Lactation Note (Signed)
This note was copied from a baby's chart. Lactation Consultation Note  Patient Name: Wanda Bass Today's Date: 01/23/2019 Reason for consult: Initial assessment   Maternal Data Formula Feeding for Exclusion: No Does the patient have breastfeeding experience prior to this delivery?: Yes Breastfed first child x 1 yr and 2nd  For 8 mths Feeding Feeding Type: Breast Fed  LATCH Score Latch: (no feeding observed)                 Interventions:  Lactation phone no and name written on white board      Lactation Tools Discussed/Used [place] Program: Yes   Consult Status Consult Status: PRN    Ferol Luz 01/23/2019, 11:38 AM

## 2019-01-23 NOTE — Lactation Note (Signed)
This note was copied from a baby's chart. Lactation Consultation Note  Patient Name: Girl Deanda Saleah Angers [id number] Date: 01/23/2019 Reason for consult: Follow-up assessment   Maternal Data Formula Feeding for Exclusion: No Does the patient have breastfeeding experience prior to this delivery?: Yes Last child is 35 yr old Feeding Feeding Type: Breast Fed  LATCH Score Latch: Grasps breast easily, tongue down, lips flanged, rhythmical sucking.  Audible Swallowing: A few with stimulation  Type of Nipple: Everted at rest and after stimulation  Comfort (Breast/Nipple): Soft / non-tender  Hold (Positioning): No assistance needed to correctly position infant at breast.  LATCH Score: 9  Interventions Interventions: (interpretation per spanish interpreter online, no questions )  Lactation Tools Discussed/Used WIC Program: Yes   Consult Status Consult Status: PRN    Ferol Luz 01/23/2019, 12:49 PM

## 2019-01-24 ENCOUNTER — Ambulatory Visit: Payer: Self-pay

## 2019-01-24 MED ORDER — ACETAMINOPHEN 325 MG PO TABS: 650.0000 mg | ORAL_TABLET | ORAL | 0 refills | Status: AC | PRN

## 2019-01-24 MED ORDER — IBUPROFEN 600 MG PO TABS: 600.0000 mg | ORAL_TABLET | Freq: Four times a day (QID) | ORAL | 0 refills | Status: AC | PRN

## 2019-01-24 NOTE — Lactation Note (Signed)
This note was copied from a baby's chart. Lactation Consultation Note  Patient Name: Wanda Bass [id number] Date: 01/24/2019 Reason for consult: Follow-up assessment  Observed end of breast feed.  Mom gave 1 1/2 ounces formula after breast feed.  Mom discouraged from giving large volumes formula explaining risks to successful breast feeding.  Mom denies painful breasts or nipples.  Reviewed supply and demand,  newborn stomach size, normal course of lactation and routine newborn feeding patterns.  Discussed feeding cues and encouraged to put baby to breast whenever he demonstrated hunger cues to bring in mature milk and ensure a plentiful milk supply.  Lactation name and number written on white board and encouraged to call with any questions, concerns or assistance.     Maternal Data Formula Feeding for Exclusion: No Has patient been taught Hand Expression?: Yes Does the patient have breastfeeding experience prior to this delivery?: Yes  Feeding Feeding Type: Bottle Fed - Formula Nipple Type: Slow - flow  LATCH Score Latch: Repeated attempts needed to sustain latch, nipple held in mouth throughout feeding, stimulation needed to elicit sucking reflex.  Audible Swallowing: A few with stimulation  Type of Nipple: Everted at rest and after stimulation  Comfort (Breast/Nipple): Soft / non-tender  Hold (Positioning): No assistance needed to correctly position infant at breast.  LATCH Score: 8  Interventions Interventions: Breast feeding basics reviewed;Breast compression  Lactation Tools Discussed/Used WIC Program: Yes   Consult Status Consult Status: PRN    Jarold Motto 01/24/2019, 11:40 AM

## 2019-01-24 NOTE — Progress Notes (Signed)
Discharge instructions given and prescriptions sent to pharmacy by provider. Interpreter used for discharge instructions. Patient verbalizes understanding of teaching. Patient discharged home via wheelchair at 1135.
